# Patient Record
Sex: Female | Born: 1970 | Race: Black or African American | Hispanic: No | Marital: Single | Smoking: Former smoker
Health system: Southern US, Community
[De-identification: ages and names within clinical notes are randomized; demographics above are authoritative.]

## PROBLEM LIST (undated history)

## (undated) DIAGNOSIS — I639 Cerebral infarction, unspecified: Secondary | ICD-10-CM

---

## 2006-07-07 ENCOUNTER — Emergency Department: Payer: Self-pay | Admitting: Emergency Medicine

## 2014-06-28 ENCOUNTER — Emergency Department: Payer: Self-pay | Admitting: Emergency Medicine

## 2017-03-25 ENCOUNTER — Encounter: Payer: Self-pay | Admitting: Emergency Medicine

## 2017-03-25 ENCOUNTER — Other Ambulatory Visit: Payer: Self-pay

## 2017-03-25 ENCOUNTER — Emergency Department: Payer: Self-pay

## 2017-03-25 ENCOUNTER — Emergency Department
Admission: EM | Admit: 2017-03-25 | Discharge: 2017-03-25 | Disposition: A | Discharge status: Home / Self Care | Payer: Self-pay | Attending: Emergency Medicine | Admitting: Emergency Medicine

## 2017-03-25 DIAGNOSIS — Y999 Unspecified external cause status: Secondary | ICD-10-CM | POA: Insufficient documentation

## 2017-03-25 DIAGNOSIS — R260 Ataxic gait: Secondary | ICD-10-CM | POA: Insufficient documentation

## 2017-03-25 DIAGNOSIS — Y9389 Activity, other specified: Secondary | ICD-10-CM | POA: Insufficient documentation

## 2017-03-25 DIAGNOSIS — Z8673 Personal history of transient ischemic attack (TIA), and cerebral infarction without residual deficits: Secondary | ICD-10-CM | POA: Insufficient documentation

## 2017-03-25 DIAGNOSIS — S0990XA Unspecified injury of head, initial encounter: Secondary | ICD-10-CM | POA: Insufficient documentation

## 2017-03-25 DIAGNOSIS — R52 Pain, unspecified: Secondary | ICD-10-CM

## 2017-03-25 DIAGNOSIS — Y92410 Unspecified street and highway as the place of occurrence of the external cause: Secondary | ICD-10-CM | POA: Insufficient documentation

## 2017-03-25 DIAGNOSIS — F10929 Alcohol use, unspecified with intoxication, unspecified: Secondary | ICD-10-CM | POA: Insufficient documentation

## 2017-03-25 HISTORY — DX: Cerebral infarction, unspecified: I63.9

## 2017-03-25 LAB — CBC WITH DIFFERENTIAL/PLATELET
BASOS PCT: 1 %
Basophils Absolute: 0.1 10*3/uL (ref 0–0.1)
Eosinophils Absolute: 0 10*3/uL (ref 0–0.7)
Eosinophils Relative: 1 %
HEMATOCRIT: 38.3 % (ref 35.0–47.0)
HEMOGLOBIN: 12.5 g/dL (ref 12.0–16.0)
LYMPHS ABS: 0.8 10*3/uL — AB (ref 1.0–3.6)
LYMPHS PCT: 12 %
MCH: 27.1 pg (ref 26.0–34.0)
MCHC: 32.6 g/dL (ref 32.0–36.0)
MCV: 83.3 fL (ref 80.0–100.0)
MONOS PCT: 9 %
Monocytes Absolute: 0.5 10*3/uL (ref 0.2–0.9)
NEUTROS ABS: 4.9 10*3/uL (ref 1.4–6.5)
NEUTROS PCT: 77 %
Platelets: 315 10*3/uL (ref 150–440)
RBC: 4.6 MIL/uL (ref 3.80–5.20)
RDW: 16.6 % — ABNORMAL HIGH (ref 11.5–14.5)
WBC: 6.3 10*3/uL (ref 3.6–11.0)

## 2017-03-25 LAB — ETHANOL: Alcohol, Ethyl (B): 124 mg/dL — ABNORMAL HIGH (ref <10)

## 2017-03-25 LAB — COMPREHENSIVE METABOLIC PANEL
ALBUMIN: 4.4 g/dL (ref 3.5–5.0)
ALT: 18 U/L (ref 14–54)
ANION GAP: 9 (ref 5–15)
AST: 23 U/L (ref 15–41)
Alkaline Phosphatase: 57 U/L (ref 38–126)
BUN: 12 mg/dL (ref 6–20)
CHLORIDE: 108 mmol/L (ref 101–111)
CO2: 25 mmol/L (ref 22–32)
Calcium: 9.2 mg/dL (ref 8.9–10.3)
Creatinine, Ser: 0.61 mg/dL (ref 0.44–1.00)
GFR calc Af Amer: >60 mL/min (ref >60)
GFR calc non Af Amer: >60 mL/min (ref >60)
GLUCOSE: 100 mg/dL — AB (ref 65–99)
POTASSIUM: 3.8 mmol/L (ref 3.5–5.1)
SODIUM: 142 mmol/L (ref 135–145)
TOTAL PROTEIN: 8.2 g/dL — AB (ref 6.5–8.1)
Total Bilirubin: 0.5 mg/dL (ref 0.3–1.2)

## 2017-03-25 LAB — URINALYSIS, COMPLETE (UACMP) WITH MICROSCOPIC
BACTERIA UA: NONE SEEN
BILIRUBIN URINE: NEGATIVE
Glucose, UA: NEGATIVE mg/dL
KETONES UR: NEGATIVE mg/dL
Nitrite: NEGATIVE
Protein, ur: NEGATIVE mg/dL
SPECIFIC GRAVITY, URINE: 1.012 (ref 1.005–1.030)
pH: 5 (ref 5.0–8.0)

## 2017-03-25 LAB — URINE DRUG SCREEN, QUALITATIVE (ARMC ONLY)
Amphetamines, Ur Screen: NOT DETECTED
BARBITURATES, UR SCREEN: NOT DETECTED
Benzodiazepine, Ur Scrn: NOT DETECTED
COCAINE METABOLITE, UR ~~LOC~~: NOT DETECTED
Cannabinoid 50 Ng, Ur ~~LOC~~: NOT DETECTED
MDMA (Ecstasy)Ur Screen: NOT DETECTED
METHADONE SCREEN, URINE: NOT DETECTED
OPIATE, UR SCREEN: NOT DETECTED
Phencyclidine (PCP) Ur S: NOT DETECTED
Tricyclic, Ur Screen: NOT DETECTED

## 2017-03-25 MED ORDER — IOPAMIDOL (ISOVUE-300) INJECTION 61%
100.0000 mL | Freq: Once | INTRAVENOUS | Status: AC | PRN
  Administered 2017-03-25: 100 mL via INTRAVENOUS
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
## 2017-03-25 NOTE — ED Notes (Signed)
Patient transported to CT 

## 2017-03-25 NOTE — ED Provider Notes (Signed)
-----------------------------------------   8:07 AM on 03/25/2017 -----------------------------------------  Patient's workup shows an elevated ethanol level of 124, labs are largely nonrevealing otherwise.  Urine drug screen is negative.  CT imaging of the head, C-spine, chest abdomen and pelvis are normal.  Patient is doing well, currently eating and drinking in the emergency department without issue.  Patient will be discharged.   Minna AntisPaduchowski, Michell Kader, MD 03/25/17 [phone]
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
## 2017-03-25 NOTE — ED Notes (Signed)
Pt a/o talking on phone. MOE x 4. Awaiting on CT results.
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
## 2017-03-25 NOTE — ED Notes (Signed)
Returned to room.
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
